# Patient Record
Sex: Male | Born: 1935 | Race: White | Hispanic: No | Marital: Married | Smoking: Never smoker
Health system: Southern US, Community
[De-identification: ages and names within clinical notes are randomized; demographics above are authoritative.]

## PROBLEM LIST (undated history)

## (undated) DIAGNOSIS — Y842 Radiological procedure and radiotherapy as the cause of abnormal reaction of the patient, or of later complication, without mention of misadventure at the time of the procedure: Secondary | ICD-10-CM

## (undated) DIAGNOSIS — C801 Malignant (primary) neoplasm, unspecified: Secondary | ICD-10-CM

## (undated) DIAGNOSIS — E119 Type 2 diabetes mellitus without complications: Secondary | ICD-10-CM

## (undated) DIAGNOSIS — K219 Gastro-esophageal reflux disease without esophagitis: Secondary | ICD-10-CM

## (undated) DIAGNOSIS — I10 Essential (primary) hypertension: Secondary | ICD-10-CM

## (undated) DIAGNOSIS — K117 Disturbances of salivary secretion: Secondary | ICD-10-CM

---

## 2006-06-24 ENCOUNTER — Emergency Department (HOSPITAL_COMMUNITY): Admission: EM | Admit: 2006-06-24 | Discharge: 2006-06-24 | Payer: Self-pay | Admitting: Emergency Medicine
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
## 2018-06-03 ENCOUNTER — Encounter (HOSPITAL_BASED_OUTPATIENT_CLINIC_OR_DEPARTMENT_OTHER): Payer: Self-pay | Admitting: Emergency Medicine

## 2018-06-03 ENCOUNTER — Other Ambulatory Visit: Payer: Self-pay

## 2018-06-03 ENCOUNTER — Emergency Department (HOSPITAL_BASED_OUTPATIENT_CLINIC_OR_DEPARTMENT_OTHER)
Admission: EM | Admit: 2018-06-03 | Discharge: 2018-06-03 | Disposition: A | Discharge status: Home / Self Care | Payer: Medicare Other | Attending: Emergency Medicine | Admitting: Emergency Medicine

## 2018-06-03 DIAGNOSIS — R300 Dysuria: Secondary | ICD-10-CM

## 2018-06-03 DIAGNOSIS — E119 Type 2 diabetes mellitus without complications: Secondary | ICD-10-CM | POA: Diagnosis not present

## 2018-06-03 DIAGNOSIS — Z8551 Personal history of malignant neoplasm of bladder: Secondary | ICD-10-CM | POA: Insufficient documentation

## 2018-06-03 DIAGNOSIS — I10 Essential (primary) hypertension: Secondary | ICD-10-CM | POA: Diagnosis not present

## 2018-06-03 HISTORY — DX: Disturbances of salivary secretion: K11.7

## 2018-06-03 HISTORY — DX: Type 2 diabetes mellitus without complications: E11.9

## 2018-06-03 HISTORY — DX: Essential (primary) hypertension: I10

## 2018-06-03 HISTORY — DX: Radiological procedure and radiotherapy as the cause of abnormal reaction of the patient, or of later complication, without mention of misadventure at the time of the procedure: Y84.2

## 2018-06-03 HISTORY — DX: Malignant (primary) neoplasm, unspecified: C80.1

## 2018-06-03 HISTORY — DX: Gastro-esophageal reflux disease without esophagitis: K21.9

## 2018-06-03 LAB — URINALYSIS, ROUTINE W REFLEX MICROSCOPIC
Bilirubin Urine: NEGATIVE
GLUCOSE, UA: NEGATIVE mg/dL
HGB URINE DIPSTICK: NEGATIVE
Ketones, ur: NEGATIVE mg/dL
LEUKOCYTES UA: NEGATIVE
NITRITE: NEGATIVE
PH: 8.5 — AB (ref 5.0–8.0)
Protein, ur: NEGATIVE mg/dL
SPECIFIC GRAVITY, URINE: 1.01 (ref 1.005–1.030)

## 2018-06-03 MED ORDER — TAMSULOSIN HCL 0.4 MG PO CAPS: 0.4000 mg | ORAL_CAPSULE | Freq: Every day | ORAL | 0 refills | Status: AC

## 2018-06-03 NOTE — ED Notes (Signed)
Pt verbalizes understanding of d/c instructions.

## 2018-06-03 NOTE — ED Triage Notes (Signed)
Reports dysuria which began yesterday.  Denies flank pain, fever.

## 2018-06-03 NOTE — Discharge Instructions (Signed)
Make sure you are staying well-hydrated with water.  This is very important. Take Flomax daily for the next week and reassess if this is helping your symptoms.  If it is, you may need a refill which can be done by your urologist or primary care doctor. Follow-up with your urologist for further evaluation of your symptoms. Your urine was negative for infection today, but has been sent to grow out.  If it is positive, you will receive a phone call in some antibiotics will be called in. Return to the emergency room with any new, worsening, concerning symptoms.

## 2018-06-03 NOTE — ED Notes (Signed)
Urine sent to lab.  Not enough to obtain culture.

## 2018-06-03 NOTE — ED Provider Notes (Signed)
[facility] EMERGENCY DEPARTMENT Provider Note   CSN: [id number] Arrival date & time: 06/03/18  1056     History   Chief Complaint Chief Complaint  Patient presents with  . Urinary Tract Infection    HPI Levi Chung is a 82 y.o. male presenting for evaluation of dysuria.  Patient states over the past 2 days, he has been having dysuria when urinating at night.  No dysuria during the day.  Pain is at the tip of his penis, and improves after he urinates.  Patient states earlier today, he felt like he could not urinate, and afterwards urinated every 15 minutes ~4 times.  Patient denies fevers, chills, nausea, vomiting, abdominal pain.  He denies hematuria.  He has a history of bladder cancer in remission.  He does follow with urology, has not needed to see them for several months.  He is not currently receiving chemo or radiation.  Additional history of diabetes, hypertension, and history of throat cancer, also in remission.  Patient was recently started on Aricept for dementia.  HPI  Past Medical History:  Diagnosis Date  . Cancer ([place])    bladder  . Diabetes mellitus without complication ([place])   . GERD (gastroesophageal reflux disease)   . Hypertension   . Xerostomia due to radiotherapy     There are no active problems to display for this patient.   History reviewed. No pertinent surgical history.      Home Medications    Prior to Admission medications   Medication Sig Start Date End Date Taking? Authorizing Provider  tamsulosin (FLOMAX) 0.4 MG CAPS capsule Take 1 capsule (0.4 mg total) by mouth daily for 7 days. 06/03/18 06/10/18  Mamoudou Mulvehill, PA-C    Family History History reviewed. No pertinent family history.  Social History Social History   Tobacco Use  . Smoking status: Never Smoker  . Smokeless tobacco: Never Used  Substance Use Topics  . Alcohol use: Never    Frequency: Never  . Drug use: Never     Allergies     Promethazine   Review of Systems Review of Systems  Genitourinary: Positive for difficulty urinating, dysuria and frequency.  All other systems reviewed and are negative.    Physical Exam Updated Vital Signs BP (!) 153/83 (BP Location: Left Arm)   Pulse 73   Temp 97.6 F (36.4 C) (Oral)   Resp 20   Ht 5\' 9"  (1.753 m)   Wt 73 kg   SpO2 100%   BMI 23.78 kg/m   Physical Exam  Constitutional: He is oriented to person, place, and time. He appears well-developed and well-nourished. No distress.  Elderly male sitting comfortably in the bed in no acute distress  HENT:  Head: Normocephalic and atraumatic.  Eyes: Pupils are equal, round, and reactive to light. Conjunctivae and EOM are normal.  Neck: Normal range of motion. Neck supple.  Cardiovascular: Normal rate, regular rhythm and intact distal pulses.  Pulmonary/Chest: Effort normal and breath sounds normal. No respiratory distress. He has no wheezes.  Abdominal: Soft. He exhibits no distension and no mass. There is no tenderness. There is no rebound and no guarding.  No tenderness palpation the abdomen.  Soft without rigidity, guarding, distention.  Genitourinary: Testes normal and penis normal. Right testis shows no swelling and no tenderness. Left testis shows no swelling and no tenderness. Uncircumcised. No phimosis or penile tenderness. No discharge found.  Genitourinary Comments: Chaperone present.  Mild irritation just at the meatus.  No obvious stricture or discharge.  No phimosis or obvious signs of balanitis.   Musculoskeletal: Normal range of motion.  Lymphadenopathy: No inguinal adenopathy noted on the right or left side.  Neurological: He is alert and oriented to person, place, and time.  Skin: Skin is warm and dry.  Psychiatric: He has a normal mood and affect.  Nursing note and vitals reviewed.    ED Treatments / Results  Labs (all labs ordered are listed, but only abnormal results are displayed) Labs  Reviewed  URINALYSIS, ROUTINE W REFLEX MICROSCOPIC - Abnormal; Notable for the following components:      Result Value   APPearance CLOUDY (*)    pH 8.5 (*)    All other components within normal limits  URINE CULTURE    EKG None  Radiology No results found.  Procedures Procedures (including critical care time)  Medications Ordered in ED Medications - No data to display   Initial Impression / Assessment and Plan / ED Course  I have reviewed the triage vital signs and the nursing notes.  Pertinent labs & imaging results that were available during my care of the patient were reviewed by me and considered in my medical decision making (see chart for details).     Pt presenting for evaluation of dysuria.  Physical exam reassuring, he is afebrile not tachycardic.  Appears nontoxic.  Urine without signs of infection.  Will send for culture considering age and history of diabetes and bladder cancer.  On exam, patient with mild irritation of the meatus, but no obvious balanitis, phimosis, discharge, swelling, or pain.  Will have patient follow-up with urology for further evaluation.  Consider possible BPH versus meatal irritation versus stricture.  Case discussed with attending, Dr. Rex Kras evaluated the patient.  Will discharge patient on a trial of Flomax.  At this time, patient proceed for discharge.  Return precautions given.  Patient and daughter state they understand and agree plan.   Final Clinical Impressions(s) / ED Diagnoses   Final diagnoses:  Dysuria    ED Discharge Orders         Ordered    tamsulosin (FLOMAX) 0.4 MG CAPS capsule  Daily     06/03/18 1231           Franchot Heidelberg, PA-C 06/03/18 1301    Little, Wenda Overland, MD 06/04/18 1513

## 2018-06-04 LAB — URINE CULTURE: Culture: NO GROWTH

## 2021-03-02 ENCOUNTER — Emergency Department (HOSPITAL_BASED_OUTPATIENT_CLINIC_OR_DEPARTMENT_OTHER)
Admission: EM | Admit: 2021-03-02 | Discharge: 2021-03-02 | Disposition: A | Discharge status: Home / Self Care | Payer: Medicare Other | Attending: Emergency Medicine | Admitting: Emergency Medicine

## 2021-03-02 ENCOUNTER — Encounter (HOSPITAL_BASED_OUTPATIENT_CLINIC_OR_DEPARTMENT_OTHER): Payer: Self-pay | Admitting: *Deleted

## 2021-03-02 ENCOUNTER — Other Ambulatory Visit: Payer: Self-pay

## 2021-03-02 DIAGNOSIS — N3001 Acute cystitis with hematuria: Secondary | ICD-10-CM

## 2021-03-02 DIAGNOSIS — Z8551 Personal history of malignant neoplasm of bladder: Secondary | ICD-10-CM | POA: Diagnosis not present

## 2021-03-02 DIAGNOSIS — E119 Type 2 diabetes mellitus without complications: Secondary | ICD-10-CM | POA: Diagnosis not present

## 2021-03-02 DIAGNOSIS — I10 Essential (primary) hypertension: Secondary | ICD-10-CM | POA: Insufficient documentation

## 2021-03-02 DIAGNOSIS — R350 Frequency of micturition: Secondary | ICD-10-CM | POA: Diagnosis present

## 2021-03-02 LAB — CBC
HCT: 39.7 % (ref 39.0–52.0)
Hemoglobin: 13.6 g/dL (ref 13.0–17.0)
MCH: 30.1 pg (ref 26.0–34.0)
MCHC: 34.3 g/dL (ref 30.0–36.0)
MCV: 87.8 fL (ref 80.0–100.0)
Platelets: 180 10*3/uL (ref 150–400)
RBC: 4.52 MIL/uL (ref 4.22–5.81)
RDW: 13 % (ref 11.5–15.5)
WBC: 13.5 10*3/uL — ABNORMAL HIGH (ref 4.0–10.5)
nRBC: 0 % (ref 0.0–0.2)

## 2021-03-02 LAB — URINALYSIS, MICROSCOPIC (REFLEX): WBC, UA: >50 WBC/hpf (ref 0–5)

## 2021-03-02 LAB — URINALYSIS, ROUTINE W REFLEX MICROSCOPIC
Bilirubin Urine: NEGATIVE
Glucose, UA: NEGATIVE mg/dL
Ketones, ur: NEGATIVE mg/dL
Nitrite: POSITIVE — AB
Protein, ur: 100 mg/dL — AB
Specific Gravity, Urine: 1.025 (ref 1.005–1.030)
pH: 6 (ref 5.0–8.0)

## 2021-03-02 LAB — COMPREHENSIVE METABOLIC PANEL
ALT: 21 U/L (ref 0–44)
AST: 19 U/L (ref 15–41)
Albumin: 4 g/dL (ref 3.5–5.0)
Alkaline Phosphatase: 66 U/L (ref 38–126)
Anion gap: 7 (ref 5–15)
BUN: 16 mg/dL (ref 8–23)
CO2: 30 mmol/L (ref 22–32)
Calcium: 8.7 mg/dL — ABNORMAL LOW (ref 8.9–10.3)
Chloride: 95 mmol/L — ABNORMAL LOW (ref 98–111)
Creatinine, Ser: 1.05 mg/dL (ref 0.61–1.24)
GFR, Estimated: >60 mL/min (ref >60)
Glucose, Bld: 108 mg/dL — ABNORMAL HIGH (ref 70–99)
Potassium: 3.5 mmol/L (ref 3.5–5.1)
Sodium: 132 mmol/L — ABNORMAL LOW (ref 135–145)
Total Bilirubin: 2.9 mg/dL — ABNORMAL HIGH (ref 0.3–1.2)
Total Protein: 6.7 g/dL (ref 6.5–8.1)

## 2021-03-02 MED ORDER — CEPHALEXIN 500 MG PO CAPS: 500.0000 mg | ORAL_CAPSULE | Freq: Two times a day (BID) | ORAL | 0 refills | Status: AC

## 2021-03-02 NOTE — ED Provider Notes (Signed)
[facility] Provider Note   CSN: [id number] Arrival date & time: 03/02/21  [id number]     History Chief Complaint  Patient presents with   Urinary Tract Infection    Levi Chung is a 85 y.o. male has medical history significant for bladder cancer, throat cancer who presents with 1 day of dysuria, urinary frequency.  Patient also had some diarrhea x 1 day, as well as generalized confusion.  Patient denies any abdominal pain, nausea, vomiting, fever.  Has no syncope, facial drooping, unilateral weakness.  Patient does not have a history of recurrent UTIs.   Urinary Tract Infection Presenting symptoms: dysuria   Associated symptoms: urinary frequency       Past Medical History:  Diagnosis Date   Cancer ([place])    bladder   Diabetes mellitus without complication ([facility])    GERD (gastroesophageal reflux disease)    Hypertension    Xerostomia due to radiotherapy     There are no problems to display for this patient.   History reviewed. No pertinent surgical history.     No family history on file.  Social History   Tobacco Use   Smoking status: Never   Smokeless tobacco: Never  Substance Use Topics   Alcohol use: Never   Drug use: Never    Home Medications Prior to Admission medications   Medication Sig Start Date End Date Taking? Authorizing Provider  cephALEXin (KEFLEX) 500 MG capsule Take 1 capsule (500 mg total) by mouth 2 (two) times daily. 03/02/21  Yes Brightyn Mozer H, PA-C    Allergies    Promethazine  Review of Systems   Review of Systems  Constitutional:  Positive for fatigue.  Genitourinary:  Positive for dysuria and frequency.  Psychiatric/Behavioral:  Positive for confusion.   All other systems reviewed and are negative.  Physical Exam Updated Vital Signs BP (!) 141/68   Pulse 68   Temp 99.6 F (37.6 C) (Oral)   Resp 20   Wt 72.6 kg   SpO2 98%   BMI 23.63 kg/m   Physical Exam Vitals and nursing note  reviewed.  Constitutional:      General: He is not in acute distress.    Appearance: Normal appearance.  HENT:     Head: Normocephalic and atraumatic.  Eyes:     General:        Right eye: No discharge.        Left eye: No discharge.  Cardiovascular:     Rate and Rhythm: Normal rate and regular rhythm.     Heart sounds: No murmur heard.   No friction rub. No gallop.  Pulmonary:     Effort: Pulmonary effort is normal.     Breath sounds: Normal breath sounds.  Abdominal:     General: Bowel sounds are normal.     Palpations: Abdomen is soft.     Comments: Some tenderness to palpation in suprapubic region.  No flank, or CVA tenderness.  Skin:    General: Skin is warm and dry.     Capillary Refill: Capillary refill takes less than 2 seconds.  Neurological:     Mental Status: He is alert and oriented to person, place, and time.     Cranial Nerves: No cranial nerve deficit.  Psychiatric:        Mood and Affect: Mood normal.        Behavior: Behavior normal.    ED Results / Procedures / Treatments   Labs (all labs  ordered are listed, but only abnormal results are displayed) Labs Reviewed  COMPREHENSIVE METABOLIC PANEL - Abnormal; Notable for the following components:      Result Value   Sodium 132 (*)    Chloride 95 (*)    Glucose, Bld 108 (*)    Calcium 8.7 (*)    Total Bilirubin 2.9 (*)    All other components within normal limits  CBC - Abnormal; Notable for the following components:   WBC 13.5 (*)    All other components within normal limits  URINALYSIS, ROUTINE W REFLEX MICROSCOPIC - Abnormal; Notable for the following components:   APPearance CLOUDY (*)    Hgb urine dipstick LARGE (*)    Protein, ur 100 (*)    Nitrite POSITIVE (*)    Leukocytes,Ua MODERATE (*)    All other components within normal limits  URINALYSIS, MICROSCOPIC (REFLEX) - Abnormal; Notable for the following components:   Bacteria, UA MANY (*)    All other components within normal limits     EKG None  Radiology No results found.  Procedures Procedures   Medications Ordered in ED Medications - No data to display  ED Course  I have reviewed the triage vital signs and the nursing notes.  Pertinent labs & imaging results that were available during my care of the patient were reviewed by me and considered in my medical decision making (see chart for details).    MDM Rules/Calculators/A&P                         I discussed this case with my attending physician who cosigned this note including patient's presenting symptoms, physical exam, and planned diagnostics and interventions. Attending physician stated agreement with plan or made changes to plan which were implemented.   Overall well-appearing, with labs showing clear urinary tract infection.  In elderly patient, favor urinary tract infection as cause of some confusion, and weakness.  Patient also has an elevated bilirubin, however comparison of previous labs from 2 years ago reveal chronically elevated bilirubin.  Will treat urinary tract infection with Keflex.  No fever, flank pain, nausea, vomiting do not favor any evidence of pyelonephritis, nephrolithiasis at this time.  No signs of systemic infection.  Return precautions given. Final Clinical Impression(s) / ED Diagnoses Final diagnoses:  Acute cystitis with hematuria    Rx / DC Orders ED Discharge Orders          Ordered    cephALEXin (KEFLEX) 500 MG capsule  2 times daily        03/02/21 1445             Deklin Bieler, Snead H, PA-C 03/02/21 1450    Fredia Sorrow, MD 03/07/21 1517

## 2021-03-02 NOTE — ED Notes (Signed)
States having difficulty with urination, having a lot of urgency and c/o burning with urination.

## 2021-03-02 NOTE — ED Triage Notes (Signed)
Pt began having urinary frequency and burning with urination yesterday.  Pt also had diarrhea and granddaughter reports some confusion and generalized weakness.

## 2021-03-02 NOTE — Discharge Instructions (Signed)
His bilirubin was elevated on lab work today, however it was elevated in the most recent lab work that I can find, so this may be a chronic problem he can follow-up with his primary care to investigate further, and watch out for signs of elevated bilirubin such as jaundice, yellowing of the eyes, darkening of the urine.  I prescribed an antibiotic for his urinary tract infection, please take the entire prescription.  Please return if urinary symptoms worsen or fail to improve.

## 2021-03-02 NOTE — ED Notes (Signed)
Patient discharged by Dr. Rogene Houston. No acute distress noted upon this RN's departure of patient. Vital signs stable. Patient taken to checkout window. Discharge paperwork discussed with patients granddaughter. No further questions voiced upon discharge.

## 2022-07-25 DEATH — deceased
